# Patient Record
Sex: Female | Born: 1995 | Race: Black or African American | Hispanic: No | Marital: Single | Smoking: Former smoker
Health system: Southern US, Community
[De-identification: ages and names within clinical notes are randomized; demographics above are authoritative.]

## PROBLEM LIST (undated history)

## (undated) DIAGNOSIS — E079 Disorder of thyroid, unspecified: Secondary | ICD-10-CM

## (undated) DIAGNOSIS — L509 Urticaria, unspecified: Secondary | ICD-10-CM

## (undated) DIAGNOSIS — R7303 Prediabetes: Secondary | ICD-10-CM

## (undated) DIAGNOSIS — M109 Gout, unspecified: Secondary | ICD-10-CM

## (undated) HISTORY — DX: Prediabetes: R73.03

## (undated) HISTORY — DX: Urticaria, unspecified: L50.9

---

## 2017-03-14 ENCOUNTER — Ambulatory Visit (HOSPITAL_COMMUNITY)
Admission: EM | Admit: 2017-03-14 | Discharge: 2017-03-14 | Disposition: A | Discharge status: Home / Self Care | Payer: Self-pay | Attending: Family Medicine | Admitting: Family Medicine

## 2017-03-14 ENCOUNTER — Encounter (HOSPITAL_COMMUNITY): Payer: Self-pay | Admitting: Emergency Medicine

## 2017-03-14 DIAGNOSIS — R21 Rash and other nonspecific skin eruption: Secondary | ICD-10-CM

## 2017-03-14 DIAGNOSIS — N3000 Acute cystitis without hematuria: Secondary | ICD-10-CM

## 2017-03-14 LAB — POCT URINALYSIS DIP (DEVICE)
BILIRUBIN URINE: NEGATIVE
GLUCOSE, UA: NEGATIVE mg/dL
HGB URINE DIPSTICK: NEGATIVE
Ketones, ur: NEGATIVE mg/dL
NITRITE: NEGATIVE
Protein, ur: NEGATIVE mg/dL
SPECIFIC GRAVITY, URINE: 1.015 (ref 1.005–1.030)
Urobilinogen, UA: 0.2 mg/dL (ref 0.0–1.0)
pH: 7 (ref 5.0–8.0)

## 2017-03-14 LAB — POCT PREGNANCY, URINE: Preg Test, Ur: NEGATIVE

## 2017-03-14 MED ORDER — CEPHALEXIN 500 MG PO CAPS: 500.0000 mg | ORAL_CAPSULE | Freq: Four times a day (QID) | ORAL | 0 refills | Status: DC

## 2017-03-14 MED ORDER — PHENAZOPYRIDINE HCL 200 MG PO TABS: 200.0000 mg | ORAL_TABLET | Freq: Three times a day (TID) | ORAL | 0 refills | Status: DC | PRN

## 2017-03-14 MED ORDER — CLOTRIMAZOLE-BETAMETHASONE 1-0.05 % EX CREA: TOPICAL_CREAM | CUTANEOUS | 0 refills | Status: DC

## 2017-03-14 NOTE — Discharge Instructions (Signed)
I have attached the contact information for [facility], contact them to schedule an appointment to establish for primary care.   You are being treated today for a urinary tract infection. I have prescribed Keflex, take 1 tablet 4 times a day for 5 days. I have also prescribed Pyridium. Take 1 tablet a day 3 times a day for 2 days. Drink plenty of fluids and rest. Should your symptoms fail to resolve, follow up with your primary care provider or return to clinic.   For your rash, prescribed Lotrisone, apply to the affected area twice a day. If it persists, return to clinic in one week as needed.

## 2017-03-14 NOTE — ED Triage Notes (Signed)
Rash in groin area, inner thigh area has been present for 2 weeks, initially itched, sometimes stings/  This is an area that patient shaves.    uti symptoms for 2 days .  Burning with urination Feel the need to urinate frequently, but barely any urine is passed.    Patient feels weakness at night while sleeping.

## 2017-03-15 NOTE — ED Provider Notes (Signed)
CSN: [id number]     Arrival date & time 03/14/17  1915 History   None    Chief Complaint  Patient presents with  . Urinary Tract Infection   (Consider location/radiation/quality/duration/timing/severity/associated sxs/prior Treatment) Kelly LopesDorian Potter is a 21 y.o. Female who [facility] with a chief complaint of dysuria, frequency, and urgency for 3-5 days. She also has complaint of a rash at her "bikini line" that has been present for two weeks, initially itchy, however there is no pain. Pt is sexually active, however denies female partners and does not believe she has risk for STD/STIs.   The history is provided by the patient.  Urinary Tract Infection  Pain quality:  Aching and burning Pain severity:  Moderate Onset quality:  Gradual Duration:  3 days Timing:  Intermittent Chronicity:  New Recent urinary tract infections: no   Relieved by:  Nothing Worsened by:  Nothing Ineffective treatments:  None tried Urinary symptoms: discolored urine and frequent urination   Urinary symptoms: no foul-smelling urine, no hematuria, no hesitancy and no bladder incontinence   Associated symptoms: no abdominal pain, no fever, no flank pain, no genital lesions, no nausea, no vaginal discharge and no vomiting   Risk factors: sexually active   Risk factors: not pregnant and no sexually transmitted infections     History reviewed. No pertinent past medical history. History reviewed. No pertinent surgical history. No family history on file. Social History  Substance Use Topics  . Smoking status: Never Smoker  . Smokeless tobacco: Not on file  . Alcohol use Yes   OB History    No data available     Review of Systems  Constitutional: Negative for chills and fever.  HENT: Negative.   Respiratory: Negative.   Cardiovascular: Negative.   Gastrointestinal: Negative for abdominal pain, diarrhea, nausea and vomiting.  Genitourinary: Positive for dysuria, frequency and  urgency. Negative for flank pain and vaginal discharge.  Musculoskeletal: Negative.   Skin: Positive for rash.  Neurological: Negative.     Allergies  Claritin [loratadine]  Home Medications   Prior to Admission medications   Medication Sig Start Date End Date Taking? Authorizing Provider  diphenhydrAMINE (BENADRYL) 25 mg capsule Take 25 mg by mouth every 6 (six) hours as needed.   Yes [provider]  cephALEXin (KEFLEX) 500 MG capsule Take 1 capsule (500 mg total) by mouth 4 (four) times daily. 03/14/17   Dorena BodoKennard, Juma Oxley, NP  clotrimazole-betamethasone (LOTRISONE) cream Apply to affected area 2 times daily prn 03/14/17   Dorena BodoKennard, Tala Eber, NP  phenazopyridine (PYRIDIUM) 200 MG tablet Take 1 tablet (200 mg total) by mouth 3 (three) times daily as needed for pain. 03/14/17   Dorena BodoKennard, Delenn Ahn, NP   Meds Ordered and Administered this Visit  Medications - No data to display  BP 119/86 (BP Location: Right Arm)   Pulse (!) 104   Temp 99 F (37.2 C) (Oral)   Resp 18   LMP 03/01/2017   SpO2 98%  No data found.   Physical Exam  Constitutional: She is oriented to person, place, and time. She appears well-developed and well-nourished. No distress.  HENT:  Head: Normocephalic.  Right Ear: External ear normal.  Left Ear: External ear normal.  Mouth/Throat: Oropharynx is clear and moist.  Eyes: Conjunctivae are normal.  Neck: Normal range of motion.  Abdominal: Soft. There is no tenderness.  Genitourinary:     Genitourinary Comments: Erythemic patch, without scale, there are no pustules, or vesicles  within the lesions  Neurological: She is alert and oriented to person, place, and time.  Skin: Skin is warm and dry. Capillary refill takes less than 2 seconds. Rash noted. She is not diaphoretic.  Psychiatric: She has a normal mood and affect. Her behavior is normal.  Nursing note and vitals reviewed.   Urgent Care Course     Procedures (including critical care  time)  Labs Review Labs Reviewed  POCT URINALYSIS DIP (DEVICE) - Abnormal; Notable for the following:       Result Value   Leukocytes, UA TRACE (*)    All other components within normal limits  POCT PREGNANCY, URINE    Imaging Review No results found.   Visual Acuity Review  Right Eye Distance:   Left Eye Distance:   Bilateral Distance:    Right Eye Near:   Left Eye Near:    Bilateral Near:         MDM   1. Acute cystitis without hematuria   2. Rash and nonspecific skin eruption     Kelly Potter is a 21 y.o. female here with urinary urgency, frequency, and dysuria along with a rash.  For rash, it does not appear to be herpetic, folliculitis, or cellulitis, will treat with Clotrisome. Return to clinic as needed if symptoms persist for 1-2 weeks.  Differential diagnosis for urinary symptoms includes, but is not limited to, UTI, pyelonephritis, ureterallithiasis, STI, reaction to hygiene products, and others.  VSS. Exam without any abdominal or CVAT. Pt is afebrile & has not been vomiting. UA is significant for trace leukocytes, only mildly sugestive of UTI. Will treat with Keflex and pyridium.Follow up with PCP or return to clinic in 3-5 days for recheck as needed. If symptoms worsen, develop back pain/fever/vomiting return to the urgent care, or go to the ER for further treatment.      Dorena Bodo, NP 03/15/17 [phone]

## 2017-08-07 ENCOUNTER — Ambulatory Visit: Payer: Self-pay | Admitting: *Deleted

## 2017-08-07 ENCOUNTER — Ambulatory Visit (HOSPITAL_COMMUNITY)
Admission: EM | Admit: 2017-08-07 | Discharge: 2017-08-07 | Disposition: A | Discharge status: Home / Self Care | Payer: BLUE CROSS/BLUE SHIELD | Attending: Internal Medicine | Admitting: Internal Medicine

## 2017-08-07 ENCOUNTER — Encounter (HOSPITAL_COMMUNITY): Payer: Self-pay | Admitting: Emergency Medicine

## 2017-08-07 DIAGNOSIS — R220 Localized swelling, mass and lump, head: Secondary | ICD-10-CM

## 2017-08-07 HISTORY — DX: Disorder of thyroid, unspecified: E07.9

## 2017-08-07 HISTORY — DX: Gout, unspecified: M10.9

## 2017-08-07 MED ORDER — DEXAMETHASONE SODIUM PHOSPHATE 10 MG/ML IJ SOLN
10.0000 mg | Freq: Once | INTRAMUSCULAR | Status: AC
  Administered 2017-08-07: 10 mg via INTRAMUSCULAR

## 2017-08-07 MED ORDER — PREDNISONE 10 MG PO TABS: 20.0000 mg | ORAL_TABLET | Freq: Every day | ORAL | 0 refills | Status: AC

## 2017-08-07 MED ORDER — DEXAMETHASONE SODIUM PHOSPHATE 10 MG/ML IJ SOLN
INTRAMUSCULAR | Status: AC
  Filled 2017-08-07: qty 1

## 2017-08-07 NOTE — ED Provider Notes (Signed)
[facility]    CSN: [id number] Arrival date & time: 08/07/17  1506     History   Chief Complaint Chief Complaint  Patient presents with  . Facial Swelling    HPI Kelly Potter is a 21 y.o. female coming in for facial swelling- lips and cheeks. Her swelling began last night around 8 o'clock. She took some benadryl went to sleep woke up again at 3 am with continued swelling and took another dose. She reports minor tingling and numbness, with minor pain. She reports a history of seeing an allergist for frequent hive break outs. She has never experienced facial swelling. She denies any known new contact to food or medication yesterday. No fever, nausea, vomiting, abdominal pain.    HPI  Past Medical History:  Diagnosis Date  . Gout   . Thyroid disease     There are no active problems to display for this patient.   History reviewed. No pertinent surgical history.  OB History    No data available       Home Medications    Prior to Admission medications   Medication Sig Start Date End Date Taking? Authorizing Provider  cephALEXin (KEFLEX) 500 MG capsule Take 1 capsule (500 mg total) by mouth 4 (four) times daily. 03/14/17   Kelly BodoKennard, Lawrence, NP  clotrimazole-betamethasone (LOTRISONE) cream Apply to affected area 2 times daily prn 03/14/17   Kelly BodoKennard, Lawrence, NP  diphenhydrAMINE (BENADRYL) 25 mg capsule Take 25 mg by mouth every 6 (six) hours as needed.    [provider]  phenazopyridine (PYRIDIUM) 200 MG tablet Take 1 tablet (200 mg total) by mouth 3 (three) times daily as needed for pain. 03/14/17   Kelly BodoKennard, Lawrence, NP  predniSONE (DELTASONE) 10 MG tablet Take 2 tablets (20 mg total) by mouth daily with breakfast for 4 days. 08/07/17 08/11/17  Kelly Mareno, Junius CreamerHallie C, PA-Potter    Family History History reviewed. No pertinent family history.  Social History Social History   Tobacco Use  . Smoking status: Never Smoker  Substance Use Topics  . Alcohol use:  Yes  . Drug use: No     Allergies   Claritin [loratadine]   Review of Systems Review of Systems  Constitutional: Negative for chills and fever.  HENT: Positive for facial swelling. Negative for drooling, ear pain, sore throat and trouble swallowing.   Respiratory: Negative for shortness of breath and wheezing.   Cardiovascular: Negative for chest pain.  Gastrointestinal: Negative for abdominal pain, diarrhea, nausea and vomiting.     Physical Exam Triage Vital Signs ED Triage Vitals  Enc Vitals Group     BP 08/07/17 1550 (!) 141/106     Pulse Rate 08/07/17 1550 89     Resp 08/07/17 1550 18     Temp 08/07/17 1550 98.5 F (36.9 Potter)     Temp Source 08/07/17 1550 Oral     SpO2 08/07/17 1550 100 %     Weight --      Height --      Head Circumference --      Peak Flow --      Pain Score 08/07/17 1551 3     Pain Loc --      Pain Edu? --      Excl. in GC? --    No data found.  Updated Vital Signs BP (!) 141/106 (BP Location: Right Arm)   Pulse 89   Temp 98.5 F (36.9 Potter) (Oral)   Resp 18   SpO2  100%   Physical Exam  Constitutional: She appears well-developed and well-nourished.  Overweight female in NAD  HENT:  Head: Normocephalic and atraumatic. Head is without right periorbital erythema and without left periorbital erythema.  Right Ear: Tympanic membrane and ear canal normal.  Left Ear: Tympanic membrane and ear canal normal.  Mouth/Throat: Oropharynx is clear and moist. No uvula swelling. No oropharyngeal exudate.  Cardiovascular: Normal rate and regular rhythm.  Pulmonary/Chest: Effort normal. No accessory muscle usage. No respiratory distress.  Skin:  Mild swelling to perioral area and cheeks. No erythema or warmth. No rash or hives.     UC Treatments / Results  Labs (all labs ordered are listed, but only abnormal results are displayed) Labs Reviewed - No data to display  EKG  EKG Interpretation None       Radiology No results  found.  Procedures Procedures (including critical care time)  Medications Ordered in UC Medications  dexamethasone (DECADRON) injection 10 mg (10 mg Intramuscular Given 08/07/17 1720)     Initial Impression / Assessment and Plan / UC Course  I have reviewed the triage vital signs and the nursing notes.  Pertinent labs & imaging results that were available during my care of the patient were reviewed by me and considered in my medical decision making (see chart for details).    Patient is likely experiencing an allergic reaction from an unknown cause. She was given an injection of steroids today. Prednisone 20 mg x 4 days was sent and instructed to begin taking tomorrow. Continue to take benadryl every 4 hours. Advised I expect your swelling to resolve after 2-3 days.    Informed her BP was elevated and advised to be followed by PCP.  Please go to ED if swelling worsens and you are unable to breath, tongue swelling, throat swelling.   Final Clinical Impressions(s) / UC Diagnoses   Final diagnoses:  Facial swelling    ED Discharge Orders        Ordered    predniSONE (DELTASONE) 10 MG tablet  Daily with breakfast     08/07/17 1714       Controlled Substance Prescriptions [place] Controlled Substance Registry consulted? Not Applicable   Kelly Potter, [place] 08/07/17 1731

## 2017-08-07 NOTE — Discharge Instructions (Addendum)
You are likely experiencing an allergic reaction from an unknown cause. We gave you an injection of steroids today.  Prednisone was sent and you should begin taking tomorrow.  Continue to take benadryl every 4 hours.  I expect your swelling to resolve after 2-3 days.   Please go to ED if swelling worsens and you are unable to breath, tongue swelling, throat swelling.

## 2017-08-07 NOTE — Telephone Encounter (Addendum)
Pt called and stated that her face started swelling today. It started with her lips now has gone down to her neck. She has taken several doses of Benadryl "stated 8 doses since 3am) Stated now is having a little difficult swallowing.  She has an EPI pen but it has expired.  Pt advised to go the urgent care to be checked out.   Reason for Disposition . SEVERE swelling of the entire face  Answer Assessment - Initial Assessment Questions 1. ONSET: "When did the swelling start?" (e.g., minutes, hours, days)     This morning 2. LOCATION: "What part of the face is swollen?"     Whole face 3. SEVERITY: "How swollen is it?"     severe 4. ITCHING: "Is there any itching?" If so, ask: "How much?"   (Scale 1-10; mild, moderate or severe)     no 5. PAIN: "Is the swelling painful to touch?" If so, ask: "How painful is it?"   (Scale 1-10; mild, moderate or severe)     #5 6. FEVER: "Do you have a fever?" If so, ask: "What is it, how was it measured, and when did it start?"      no 7. CAUSE: "What do you think is causing the face swelling?"     dont know, have allergies 8. RECURRENT SYMPTOM: "Have you had face swelling before?" If so, ask: "When was the last time?" "What happened that time?"     A couple of months ago it happened 9. OTHER SYMPTOMS: "Do you have any other symptoms?" (e.g., toothache, leg swelling)     Headache last night 10. PREGNANCY: "Is there any chance you are pregnant?" "When was your last menstrual period?"       No LMP now  Protocols used: [facility]

## 2017-08-07 NOTE — ED Triage Notes (Signed)
Pt here for swelling to lips and cheeks with some pain; pt unsure what could be causing; started last night; no distress noted

## 2017-08-10 NOTE — Telephone Encounter (Signed)
Opened in error

## 2017-10-12 ENCOUNTER — Encounter: Payer: BLUE CROSS/BLUE SHIELD | Admitting: Obstetrics and Gynecology

## 2018-05-19 ENCOUNTER — Ambulatory Visit (HOSPITAL_COMMUNITY)
Admission: EM | Admit: 2018-05-19 | Discharge: 2018-05-19 | Disposition: A | Discharge status: Home / Self Care | Payer: BLUE CROSS/BLUE SHIELD | Attending: Family Medicine | Admitting: Family Medicine

## 2018-05-19 ENCOUNTER — Encounter (HOSPITAL_COMMUNITY): Payer: Self-pay | Admitting: Emergency Medicine

## 2018-05-19 DIAGNOSIS — L509 Urticaria, unspecified: Secondary | ICD-10-CM

## 2018-05-19 MED ORDER — METHYLPREDNISOLONE SODIUM SUCC 125 MG IJ SOLR
80.0000 mg | Freq: Once | INTRAMUSCULAR | Status: AC
  Administered 2018-05-19: 80 mg via INTRAMUSCULAR

## 2018-05-19 MED ORDER — METHYLPREDNISOLONE SODIUM SUCC 125 MG IJ SOLR
INTRAMUSCULAR | Status: AC
  Filled 2018-05-19: qty 2

## 2018-05-19 MED ORDER — PREDNISONE 10 MG (21) PO TBPK: ORAL_TABLET | ORAL | 0 refills | Status: DC

## 2018-05-19 NOTE — Discharge Instructions (Addendum)
It was nice meeting you!!  We will treat you for hives. Steroid injection here in the clinic and prednisone prescription sent to the pharmacy Benadryl for itching. You can also use zyrtec it is less drowsy.  Follow up as needed for continued or worsening symptoms

## 2018-05-19 NOTE — ED Provider Notes (Addendum)
[facility]    CSN: [id number] Arrival date & time: 05/19/18  1140     History   Chief Complaint Chief Complaint  Patient presents with  . Rash    HPI Kelly Potter is a 22 y.o. female.   Patient is a 22 year old female past medical history of gout and thyroid disease.  She presents with hives and pruritus.  This is chronic for her and flares up from time to time.  She has been having it intermittently over the last couple months.  She uses Benadryl with typical relief from that.  The current episode has been 2 days.  She has been treating with Benadryl but only getting worse.  She feels that her lips are slightly swollen.  She woke up this morning with some eye swelling.  She denies any throat swelling, chest pain, shortness of breath. She has been allergy  tested in the past and is allergic to many things. She reports they are never able to pin point what causes the flares.   She does not smoke  ROS per HPI      Past Medical History:  Diagnosis Date  . Gout   . Thyroid disease     There are no active problems to display for this patient.   History reviewed. No pertinent surgical history.  OB History   None      Home Medications    Prior to Admission medications   Medication Sig Start Date End Date Taking? Authorizing Provider  cephALEXin (KEFLEX) 500 MG capsule Take 1 capsule (500 mg total) by mouth 4 (four) times daily. Patient not taking: Reported on 05/19/2018 03/14/17   Dorena Bodo, NP  clotrimazole-betamethasone (LOTRISONE) cream Apply to affected area 2 times daily prn 03/14/17   Dorena Bodo, NP  diphenhydrAMINE (BENADRYL) 25 mg capsule Take 25 mg by mouth every 6 (six) hours as needed.    [provider]  phenazopyridine (PYRIDIUM) 200 MG tablet Take 1 tablet (200 mg total) by mouth 3 (three) times daily as needed for pain. Patient not taking: Reported on 05/19/2018 03/14/17   Dorena Bodo, NP  predniSONE (STERAPRED UNI-PAK  21 TAB) 10 MG (21) TBPK tablet 6 tabs for 1 day, then 5 tabs for 1 das, then 4 tabs for 1 day, then 3 tabs for 1 day, 2 tabs for 1 day, then 1 tab for 1 day 05/19/18   Janace Aris, NP    Family History History reviewed. No pertinent family history.  Social History Social History   Tobacco Use  . Smoking status: Never Smoker  Substance Use Topics  . Alcohol use: Yes  . Drug use: No     Allergies   Claritin [loratadine]   Review of Systems Review of Systems   Physical Exam Triage Vital Signs ED Triage Vitals [05/19/18 1239]  Enc Vitals Group     BP 124/77     Pulse Rate 99     Resp 18     Temp 98.1 F (36.7 C)     Temp Source Oral     SpO2 100 %     Weight      Height      Head Circumference      Peak Flow      Pain Score      Pain Loc      Pain Edu?      Excl. in GC?    No data found.  Updated Vital Signs BP 124/77 (BP Location:  Left Arm)   Pulse 99   Temp 98.1 F (36.7 C) (Oral)   Resp 18   SpO2 100%   Visual Acuity Right Eye Distance:   Left Eye Distance:   Bilateral Distance:    Right Eye Near:   Left Eye Near:    Bilateral Near:     Physical Exam  Constitutional: She is oriented to person, place, and time. No distress.  Very pleasant. Non toxic or ill appearing.     HENT:  Head: Normocephalic and atraumatic.  Eyes: Conjunctivae are normal.  Neck: Normal range of motion.  Cardiovascular: Normal rate and regular rhythm.  Pulmonary/Chest: Effort normal.  Musculoskeletal: Normal range of motion.  Neurological: She is alert and oriented to person, place, and time.  Skin: Skin is warm and dry.  Widespread hives.   Psychiatric: She has a normal mood and affect.  Nursing note and vitals reviewed.    UC Treatments / Results  Labs (all labs ordered are listed, but only abnormal results are displayed) Labs Reviewed - No data to display  EKG None  Radiology No results found.  Procedures Procedures (including critical care  time)  Medications Ordered in UC Medications  methylPREDNISolone sodium succinate (SOLU-MEDROL) 125 mg/2 mL injection 80 mg (has no administration in time range)    Initial Impression / Assessment and Plan / UC Course  I have reviewed the triage vital signs and the nursing notes.  Pertinent labs & imaging results that were available during my care of the patient were reviewed by me and considered in my medical decision making (see chart for details).     Hives- steroid injection in clinic Prescription for prednisone.  benadryl or zyrtec as needed for itching.  Follow up as needed for continued or worsening symptoms  Final Clinical Impressions(s) / UC Diagnoses   Final diagnoses:  Hives     Discharge Instructions     It was nice meeting you!!  We will treat you for hives. Steroid injection here in the clinic and prednisone prescription sent to the pharmacy Benadryl for itching. You can also use zyrtec it is less drowsy.  Follow up as needed for continued or worsening symptoms       ED Prescriptions    Medication Sig Dispense Auth. Provider   predniSONE (STERAPRED UNI-PAK 21 TAB) 10 MG (21) TBPK tablet 6 tabs for 1 day, then 5 tabs for 1 das, then 4 tabs for 1 day, then 3 tabs for 1 day, 2 tabs for 1 day, then 1 tab for 1 day 21 tablet Jaci Lazier, Fidencio Duddy A, NP     Controlled Substance Prescriptions [place] Controlled Substance Registry consulted? Not Applicable   Janace Aris, NP 05/19/18 1308    Dahlia Byes A, NP 05/19/18 1310

## 2018-05-19 NOTE — ED Triage Notes (Signed)
Pt here for rash that is chronic in nature; pt sts started this am

## 2021-01-07 ENCOUNTER — Ambulatory Visit (INDEPENDENT_AMBULATORY_CARE_PROVIDER_SITE_OTHER): Payer: 59 | Admitting: Primary Care

## 2021-01-07 ENCOUNTER — Other Ambulatory Visit: Payer: Self-pay

## 2021-01-07 ENCOUNTER — Encounter (INDEPENDENT_AMBULATORY_CARE_PROVIDER_SITE_OTHER): Payer: Self-pay | Admitting: Primary Care

## 2021-01-07 VITALS — BP 100/72 | HR 69 | Temp 97.3°F | Ht 65.0 in | Wt 297.6 lb

## 2021-01-07 DIAGNOSIS — E079 Disorder of thyroid, unspecified: Secondary | ICD-10-CM | POA: Diagnosis not present

## 2021-01-07 DIAGNOSIS — N912 Amenorrhea, unspecified: Secondary | ICD-10-CM

## 2021-01-07 DIAGNOSIS — Z833 Family history of diabetes mellitus: Secondary | ICD-10-CM | POA: Diagnosis not present

## 2021-01-07 DIAGNOSIS — Z7689 Persons encountering health services in other specified circumstances: Secondary | ICD-10-CM

## 2021-01-07 DIAGNOSIS — R7981 Abnormal blood-gas level: Secondary | ICD-10-CM

## 2021-01-07 LAB — POCT GLYCOSYLATED HEMOGLOBIN (HGB A1C): Hemoglobin A1C: 5.2 % (ref 4.0–5.6)

## 2021-01-07 NOTE — Progress Notes (Signed)
New Patient Office Visit  Subjective:  Patient ID: Kelly Potter, female    DOB: 1995-10-06  Age: 25 y.o. MRN: [id number]  CC:  Chief Complaint  Patient presents with  . New Patient (Initial Visit)    HPI Ms. Kelly Potter 25 year old morbid obese female she  presents for establishment of care.  She admits to not having regular checkups but has decided it is time to start taking care of herself and going to doctor's.  Her main concern is she had amenorrhea for 6 months and then a cycle for 5 days.  The only lifestyle changes she thinks may was monitoring her diet trying to lose weight and exercising.  She denies being pregnant and not on any birth control. Past Medical History:  Diagnosis Date  . Gout   . Thyroid disease     History reviewed. No pertinent surgical history.  Family history of grandmother on insulin deceased and on on mother side is a diabetic on her mom  Social History   Socioeconomic History  . Marital status: Single    Spouse name: Not on file  . Number of children: Not on file  . Years of education: Not on file  . Highest education level: Not on file  Occupational History  . Not on file  Tobacco Use  . Smoking status: Never Smoker  . Smokeless tobacco: Never Used  Substance and Sexual Activity  . Alcohol use: Yes  . Drug use: No  . Sexual activity: Not on file  Other Topics Concern  . Not on file  Social History Narrative  . Not on file   Social Determinants of Health   Financial Resource Strain: Not on file  Food Insecurity: Not on file  Transportation Needs: Not on file  Physical Activity: Not on file  Stress: Not on file  Social Connections: Not on file  Intimate Partner Violence: Not on file    ROS Review of Systems  Constitutional: Positive for fatigue.  Genitourinary: Positive for menstrual problem.  All other systems reviewed and are negative.   Objective:   Today's Vitals: BP 100/72 (BP Location: Right Arm, Patient Position:  Sitting, Cuff Size: Large)   Pulse 69   Temp (!) 97.3 F (36.3 C) (Temporal)   Ht 5' 5"  (1.651 m)   Wt 297 lb 9.6 oz (135 kg)   LMP 12/09/2020 (Exact Date)   SpO2 93%   BMI 49.52 kg/m   Physical Exam Vitals reviewed.  Constitutional:      Appearance: She is obese.     Comments: morbid  HENT:     Head: Normocephalic.     Right Ear: Tympanic membrane and external ear normal.     Left Ear: Tympanic membrane and external ear normal.     Nose: Nose normal.  Eyes:     Extraocular Movements: Extraocular movements intact.     Pupils: Pupils are equal, round, and reactive to light.  Cardiovascular:     Rate and Rhythm: Normal rate and regular rhythm.  Pulmonary:     Effort: Pulmonary effort is normal.     Breath sounds: Normal breath sounds.  Abdominal:     General: Abdomen is flat. Bowel sounds are normal. There is distension.     Palpations: Abdomen is soft.  Musculoskeletal:        General: Normal range of motion.  Skin:    General: Skin is warm and dry.  Neurological:     Mental Status: She is  alert and oriented to person, place, and time.  Psychiatric:        Mood and Affect: Mood normal.        Behavior: Behavior normal.        Thought Content: Thought content normal.        Judgment: Judgment normal.     Assessment & Plan:    Outpatient Encounter Medications as of 01/07/2021  Medication Sig  . [DISCONTINUED] cephALEXin (KEFLEX) 500 MG capsule Take 1 capsule (500 mg total) by mouth 4 (four) times daily. (Patient not taking: Reported on 05/19/2018)  . [DISCONTINUED] clotrimazole-betamethasone (LOTRISONE) cream Apply to affected area 2 times daily prn  . [DISCONTINUED] diphenhydrAMINE (BENADRYL) 25 mg capsule Take 25 mg by mouth every 6 (six) hours as needed.  . [DISCONTINUED] phenazopyridine (PYRIDIUM) 200 MG tablet Take 1 tablet (200 mg total) by mouth 3 (three) times daily as needed for pain. (Patient not taking: Reported on 05/19/2018)  . [DISCONTINUED] predniSONE  (STERAPRED UNI-PAK 21 TAB) 10 MG (21) TBPK tablet 6 tabs for 1 day, then 5 tabs for 1 das, then 4 tabs for 1 day, then 3 tabs for 1 day, 2 tabs for 1 day, then 1 tab for 1 day   No facility-administered encounter medications on file as of 01/07/2021.  Luba was seen today for new patient (initial visit).  Diagnoses and all orders for this visit:  Family history of diabetes mellitus type II -     HgB A1c 5.2 per ADA guidelines she is not cream or diabetic  Amenorrhea 6 months without a menstrual cycle prior to that she was having monthly, menstrual cycle returned  was 12/09/2020 (exact date).  Lasted exactly 5 days . She is not sexually active -     CBC with Differential  Encounter to establish care Establishing care with new PCP -     CMP14+EGFR  Morbid obesity ([place]) Morbid obesity is greater than 40 indicating an excess in caloric intake or underlining conditions. This may lead to other co-morbidities.  Examples given diabetes, hypertension and respiratory complications. Lifestyle modifications of diet and exercise may reduce obesity.  She has recently started exercising daily and monitoring her caloric intake and carbohydrates -     Lipid Panel  Thyroid disease She has a history of thyroid disease never on medication.  She also mention being fatigued and wanting to sleep all the time with some hair loss.   Will check labs -     TSH + free T4 -     CMP14+EGFR  Borderline low oxygen saturation level Oxygen saturation on room air initially was 89 rechecked and was 93.  Patient and I ran 500 feet she desatted to 80% with exertion after several deep breaths her O2 sats improved to 96% on room air.  Lungs CTA. No shortness of breath after running 500 feet   Follow-up: Return in about 6 months (around 07/09/2021) for weight check up / sats.   Kerin Perna, NP

## 2021-01-08 LAB — CBC WITH DIFFERENTIAL/PLATELET
Basophils Absolute: 0 10*3/uL (ref 0.0–0.2)
Basos: 1 %
EOS (ABSOLUTE): 0.2 10*3/uL (ref 0.0–0.4)
Eos: 3 %
Hematocrit: 41.8 % (ref 34.0–46.6)
Hemoglobin: 13 g/dL (ref 11.1–15.9)
Immature Grans (Abs): 0 10*3/uL (ref 0.0–0.1)
Immature Granulocytes: 0 %
Lymphocytes Absolute: 1.8 10*3/uL (ref 0.7–3.1)
Lymphs: 26 %
MCH: 27 pg (ref 26.6–33.0)
MCHC: 31.1 g/dL — ABNORMAL LOW (ref 31.5–35.7)
MCV: 87 fL (ref 79–97)
Monocytes Absolute: 0.8 10*3/uL (ref 0.1–0.9)
Monocytes: 11 %
Neutrophils Absolute: 4.1 10*3/uL (ref 1.4–7.0)
Neutrophils: 59 %
Platelets: 281 10*3/uL (ref 150–450)
RBC: 4.82 x10E6/uL (ref 3.77–5.28)
RDW: 13 % (ref 11.7–15.4)
WBC: 6.9 10*3/uL (ref 3.4–10.8)

## 2021-01-08 LAB — CMP14+EGFR
ALT: 12 IU/L (ref 0–32)
AST: 14 IU/L (ref 0–40)
Albumin/Globulin Ratio: 1.6 (ref 1.2–2.2)
Albumin: 4.1 g/dL (ref 3.9–5.0)
Alkaline Phosphatase: 72 IU/L (ref 44–121)
BUN/Creatinine Ratio: 7 — ABNORMAL LOW (ref 9–23)
BUN: 6 mg/dL (ref 6–20)
Bilirubin Total: 0.5 mg/dL (ref 0.0–1.2)
CO2: 19 mmol/L — ABNORMAL LOW (ref 20–29)
Calcium: 9.3 mg/dL (ref 8.7–10.2)
Chloride: 103 mmol/L (ref 96–106)
Creatinine, Ser: 0.88 mg/dL (ref 0.57–1.00)
Globulin, Total: 2.6 g/dL (ref 1.5–4.5)
Glucose: 79 mg/dL (ref 65–99)
Potassium: 4.5 mmol/L (ref 3.5–5.2)
Sodium: 137 mmol/L (ref 134–144)
Total Protein: 6.7 g/dL (ref 6.0–8.5)
eGFR: 93 mL/min/{1.73_m2} (ref >59)

## 2021-01-08 LAB — LIPID PANEL
Chol/HDL Ratio: 2.6 ratio (ref 0.0–4.4)
Cholesterol, Total: 147 mg/dL (ref 100–199)
HDL: 56 mg/dL (ref >39)
LDL Chol Calc (NIH): 77 mg/dL (ref 0–99)
Triglycerides: 72 mg/dL (ref 0–149)
VLDL Cholesterol Cal: 14 mg/dL (ref 5–40)

## 2021-01-08 LAB — TSH+FREE T4
Free T4: 0.66 ng/dL — ABNORMAL LOW (ref 0.82–1.77)
TSH: 3.35 u[IU]/mL (ref 0.450–4.500)

## 2021-01-11 ENCOUNTER — Other Ambulatory Visit (INDEPENDENT_AMBULATORY_CARE_PROVIDER_SITE_OTHER): Payer: Self-pay | Admitting: Primary Care

## 2021-01-11 MED ORDER — LEVOTHYROXINE SODIUM 25 MCG PO TABS: 25.0000 ug | ORAL_TABLET | Freq: Every day | ORAL | 1 refills | Status: DC

## 2021-02-08 ENCOUNTER — Emergency Department (HOSPITAL_COMMUNITY)
Admission: EM | Admit: 2021-02-08 | Discharge: 2021-02-08 | Disposition: A | Discharge status: Home / Self Care | Payer: Self-pay | Attending: Emergency Medicine | Admitting: Emergency Medicine

## 2021-02-08 ENCOUNTER — Other Ambulatory Visit: Payer: Self-pay

## 2021-02-08 ENCOUNTER — Emergency Department (HOSPITAL_COMMUNITY): Payer: Self-pay

## 2021-02-08 ENCOUNTER — Encounter (HOSPITAL_COMMUNITY): Payer: Self-pay

## 2021-02-08 DIAGNOSIS — R519 Headache, unspecified: Secondary | ICD-10-CM | POA: Insufficient documentation

## 2021-02-08 DIAGNOSIS — H538 Other visual disturbances: Secondary | ICD-10-CM | POA: Insufficient documentation

## 2021-02-08 DIAGNOSIS — R202 Paresthesia of skin: Secondary | ICD-10-CM | POA: Insufficient documentation

## 2021-02-08 LAB — CBC WITH DIFFERENTIAL/PLATELET
Abs Immature Granulocytes: 0.02 10*3/uL (ref 0.00–0.07)
Basophils Absolute: 0 10*3/uL (ref 0.0–0.1)
Basophils Relative: 1 %
Eosinophils Absolute: 0.1 10*3/uL (ref 0.0–0.5)
Eosinophils Relative: 2 %
HCT: 41.7 % (ref 36.0–46.0)
Hemoglobin: 13.7 g/dL (ref 12.0–15.0)
Immature Granulocytes: 0 %
Lymphocytes Relative: 30 %
Lymphs Abs: 1.9 10*3/uL (ref 0.7–4.0)
MCH: 28.2 pg (ref 26.0–34.0)
MCHC: 32.9 g/dL (ref 30.0–36.0)
MCV: 85.8 fL (ref 80.0–100.0)
Monocytes Absolute: 0.6 10*3/uL (ref 0.1–1.0)
Monocytes Relative: 9 %
Neutro Abs: 3.6 10*3/uL (ref 1.7–7.7)
Neutrophils Relative %: 58 %
Platelets: 261 10*3/uL (ref 150–400)
RBC: 4.86 MIL/uL (ref 3.87–5.11)
RDW: 13.4 % (ref 11.5–15.5)
WBC: 6.3 10*3/uL (ref 4.0–10.5)
nRBC: 0 % (ref 0.0–0.2)

## 2021-02-08 LAB — BASIC METABOLIC PANEL
Anion gap: 9 (ref 5–15)
BUN: 9 mg/dL (ref 6–20)
CO2: 20 mmol/L — ABNORMAL LOW (ref 22–32)
Calcium: 9.1 mg/dL (ref 8.9–10.3)
Chloride: 108 mmol/L (ref 98–111)
Creatinine, Ser: 0.77 mg/dL (ref 0.44–1.00)
GFR, Estimated: >60 mL/min (ref >60)
Glucose, Bld: 86 mg/dL (ref 70–99)
Potassium: 4.1 mmol/L (ref 3.5–5.1)
Sodium: 137 mmol/L (ref 135–145)

## 2021-02-08 LAB — I-STAT BETA HCG BLOOD, ED (MC, WL, AP ONLY): I-stat hCG, quantitative: <5 m[IU]/mL (ref <5)

## 2021-02-08 MED ORDER — METOCLOPRAMIDE HCL 5 MG/ML IJ SOLN
5.0000 mg | Freq: Once | INTRAMUSCULAR | Status: AC
  Administered 2021-02-08: 5 mg via INTRAVENOUS
  Filled 2021-02-08: qty 2

## 2021-02-08 MED ORDER — DIPHENHYDRAMINE HCL 50 MG/ML IJ SOLN
12.5000 mg | Freq: Once | INTRAMUSCULAR | Status: AC
  Administered 2021-02-08: 12.5 mg via INTRAVENOUS
  Filled 2021-02-08: qty 1

## 2021-02-08 MED ORDER — KETOROLAC TROMETHAMINE 30 MG/ML IJ SOLN
30.0000 mg | Freq: Once | INTRAMUSCULAR | Status: AC
  Administered 2021-02-08: 30 mg via INTRAVENOUS
  Filled 2021-02-08: qty 1

## 2021-02-08 MED ORDER — SODIUM CHLORIDE 0.9 % IV BOLUS
1000.0000 mL | Freq: Once | INTRAVENOUS | Status: AC
  Administered 2021-02-08: 1000 mL via INTRAVENOUS

## 2021-02-08 NOTE — Discharge Instructions (Signed)
You may alternate taking Tylenol and Ibuprofen as needed for pain control. You may take 400-600 mg of ibuprofen every 6 hours and [phone] mg of Tylenol every 6 hours. Do not exceed 4000 mg of Tylenol daily as this can lead to liver damage. Also, make sure to take Ibuprofen with meals as it can cause an upset stomach. Do not take other NSAIDs while taking Ibuprofen such as (Aleve, Naprosyn, Aspirin, Celebrex, etc) and do not take more than the prescribed dose as this can lead to ulcers and bleeding in your GI tract. You may use warm and cold compresses to help with your symptoms.   Please follow up with your primary doctor within the next 7-10 days for re-evaluation and further treatment of your symptoms. The neurology team will reach out to you in regards to setting up an appointment for follow up  Please return to the ER sooner if you have any new or worsening symptoms.

## 2021-02-08 NOTE — ED Notes (Signed)
Pt taken to MRI  

## 2021-02-08 NOTE — ED Triage Notes (Addendum)
Patient c/o headache, blurred vision, and tingling of her left hand left arm. Patient states she has swelling to the right face. Patient states symptoms started at 1000 this AM. Patient speaking complete sentences. No difficulty with breathing or swallowing.

## 2021-02-08 NOTE — ED Provider Notes (Signed)
[facility]-EMERGENCY DEPT Provider Note   CSN: [id number] Arrival date & time: 02/08/21  1104     History Chief Complaint  Patient presents with  . Headache  . Blurred Vision  . Tingling    Kelly Potter is a 25 y.o. female.  HPI   Pt is a 25 y/o f with a h/o gout, thyroid disease, who presents to the ED today for eval of a headache that started upon waking this morning. Pt rates headache at 7/10. States pain feels sharp and stabbing. Pain is constant.   She further reports multiple neurologic complaints that have been ongoing intermittently for the last month including tingling in various places of her body bilaterally. However, she became concerned today because she noted blurred vision in her right eye, tingling/swelling to the bilat hands (L>R), tingling to the bilat feet and swelling to the face.  She denies any associated weakness. She reports frequent headaches but this headache feels worse. She has never seen a neurologist. Denies neck pain, photosensitivity, fevers, chills, dizziness, lightheadedness.   Past Medical History:  Diagnosis Date  . Gout   . Thyroid disease     There are no problems to display for this patient.   History reviewed. No pertinent surgical history.   OB History   No obstetric history on file.     Family History  Problem Relation Age of Onset  . Hypertension Mother   . Hypertension Father   . Gout Father   . Thyroid disease Father     Social History   Tobacco Use  . Smoking status: Never Smoker  . Smokeless tobacco: Never Used  Vaping Use  . Vaping Use: Never used  Substance Use Topics  . Alcohol use: Yes  . Drug use: No    Home Medications Prior to Admission medications   Medication Sig Start Date End Date Taking? Authorizing Provider  levothyroxine (SYNTHROID) 25 MCG tablet Take 1 tablet (25 mcg total) by mouth daily before breakfast. 01/11/21   Grayce Sessions, NP    Allergies    Claritin  [loratadine]  Review of Systems   Review of Systems  Constitutional: Negative for fever.  HENT: Negative for ear pain and sore throat.   Eyes: Negative for visual disturbance.  Respiratory: Negative for cough and shortness of breath.   Cardiovascular: Negative for chest pain.  Gastrointestinal: Negative for abdominal pain, constipation, diarrhea, nausea and vomiting.  Genitourinary: Negative for dysuria and hematuria.  Musculoskeletal: Negative for back pain.  Skin: Negative for rash.  Neurological: Positive for numbness and headaches. Negative for weakness.  All other systems reviewed and are negative.   Physical Exam Updated Vital Signs BP 116/90   Pulse 78   Temp 98 F (36.7 C) (Oral)   Resp 18   Ht 5\' 5"  (1.651 m)   Wt 132.5 kg   LMP 07/11/2020   SpO2 100%   BMI 48.59 kg/m   Physical Exam Vitals and nursing note reviewed.  Constitutional:      General: She is not in acute distress.    Appearance: She is well-developed.  HENT:     Head: Normocephalic and atraumatic.  Eyes:     Conjunctiva/sclera: Conjunctivae normal.  Cardiovascular:     Rate and Rhythm: Normal rate and regular rhythm.     Heart sounds: No murmur heard.   Pulmonary:     Effort: Pulmonary effort is normal. No respiratory distress.     Breath sounds: Normal breath sounds. No  wheezing, rhonchi or rales.  Abdominal:     General: Bowel sounds are normal.     Palpations: Abdomen is soft.     Tenderness: There is no abdominal tenderness.  Musculoskeletal:     Cervical back: Neck supple.  Skin:    General: Skin is warm and dry.  Neurological:     Mental Status: She is alert.     Comments: Mental Status:  Alert, thought content appropriate, able to give a coherent history. Speech fluent without evidence of aphasia. Able to follow 2 step commands without difficulty.  Cranial Nerves:  II:  Peripheral visual fields grossly normal, pupils equal, round, reactive to light III,IV, VI: ptosis not  present, extra-ocular motions intact bilaterally  V,VII: smile symmetric, facial light touch sensation decreased on the right VIII: hearing grossly normal to voice  X: uvula elevates symmetrically  XI: bilateral shoulder shrug symmetric and strong XII: midline tongue extension without fassiculations Motor:  Normal tone. 5/5 strength of BUE and BLE major muscle groups including strong and equal grip strength and dorsiflexion/plantar flexion Sensory: decreased sensation to the LUE      ED Results / Procedures / Treatments   Labs (all labs ordered are listed, but only abnormal results are displayed) Labs Reviewed  BASIC METABOLIC PANEL - Abnormal; Notable for the following components:      Result Value   CO2 20 (*)    All other components within normal limits  CBC WITH DIFFERENTIAL/PLATELET  I-STAT BETA HCG BLOOD, ED ([facility], [facility], [facility] ONLY)    EKG None  Radiology MR BRAIN WO CONTRAST  Result Date: 02/08/2021 CLINICAL DATA:  Concern multiple sclerosis. EXAM: MRI HEAD WITHOUT CONTRAST TECHNIQUE: Multiplanar, multiecho pulse sequences of the brain and surrounding structures were obtained without intravenous contrast. COMPARISON:  None. FINDINGS: Brain: No acute infarction, hemorrhage, hydrocephalus, extra-axial collection or mass lesion. No white matter lesions. The absence of contrast precludes evaluation for enhancement. Vascular: Major arterial flow voids are maintained at the skull base. Small right vertebral artery, possibly non dominant. Skull and upper cervical spine: Normal marrow signal. Sinuses/Orbits: Clear sinuses. Other: No mastoid effusions. IMPRESSION: No evidence of acute intracranial abnormality. No white matter lesions. Electronically Signed   By: Feliberto Harts MD   On: 02/08/2021 14:04    Procedures Procedures   Medications Ordered in ED Medications  sodium chloride 0.9 % bolus 1,000 mL (1,000 mLs Intravenous New Bag/Given 02/08/21 1235)  ketorolac (TORADOL) 30 MG/ML  injection 30 mg (30 mg Intravenous Given 02/08/21 1234)  metoCLOPramide (REGLAN) injection 5 mg (5 mg Intravenous Given 02/08/21 1457)  diphenhydrAMINE (BENADRYL) injection 12.5 mg (12.5 mg Intravenous Given 02/08/21 1457)    ED Course  I have reviewed the triage vital signs and the nursing notes.  Pertinent labs & imaging results that were available during my care of the patient were reviewed by me and considered in my medical decision making (see chart for details).    MDM Rules/Calculators/A&P                          25 year old female presenting the emergency department today with headache and multiple neurologic complaints that have been ongoing intermittently for a month.  Neuro exam today is reassuring other than some sensory changes to the left hand/forearm and right side of the face. will order MRI to rule out MS or other emergent cause of symptoms.  Reviewed/interpreted labs which are reassuring.  No anemia or significant electrolyte derangement.  Kidney and liver function are reassuring.  MRI brain shows no acute abnormalities.  Patient was given migraine cocktail on reassessment she states she feels improved.  She will be referred to neurology as an outpatient we will have her return the emergency department for new or worsening symptoms in the meantime.  She voices understanding of the plan and reasons to return.  All questions answered.  Patient stable for discharge.  Final Clinical Impression(s) / ED Diagnoses Final diagnoses:  Acute nonintractable headache, unspecified headache type  Paresthesia    Rx / DC Orders ED Discharge Orders         Ordered    Ambulatory referral to Neurology       Comments: An appointment is requested in approximately: 2 weeks   02/08/21 1521           Rayne Du 02/08/21 1524    Cheryll Cockayne, MD 02/09/21 2014

## 2021-05-09 ENCOUNTER — Ambulatory Visit: Payer: BLUE CROSS/BLUE SHIELD | Admitting: Neurology

## 2021-05-09 ENCOUNTER — Encounter: Payer: Self-pay | Admitting: Neurology

## 2021-07-12 ENCOUNTER — Ambulatory Visit (INDEPENDENT_AMBULATORY_CARE_PROVIDER_SITE_OTHER): Payer: 59 | Admitting: Primary Care

## 2022-11-03 IMAGING — MR MR HEAD W/O CM
11 series · 48 of 48 positions shown · non-contrast
Comparison: None.

CLINICAL DATA: Concern multiple sclerosis.

EXAM:
MRI HEAD WITHOUT CONTRAST
TECHNIQUE: Multiplanar, multiecho pulse sequences of the brain and surrounding
structures were obtained without intravenous contrast.

[Series 5: DWI · axial · 3.0mm · 1.36mm/px · z∈[-78,+63]mm · 6 of 96 slices shown (1 of 2)]
[im 1/96]
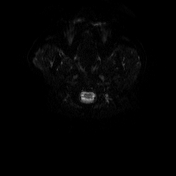
[im 20/96]
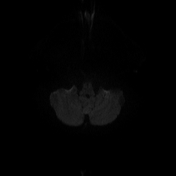
[im 39/96]
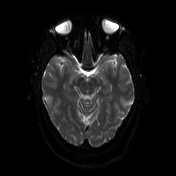
[im 58/96]
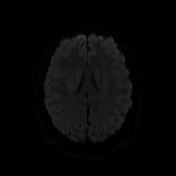
[im 77/96]
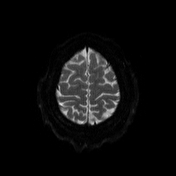
[im 96/96]
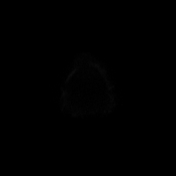

[Series 6: DWI · axial · 3.0mm · 1.36mm/px · z∈[-78,+63]mm · 3 of 48 slices shown (2 of 2)]
[im 1/48]
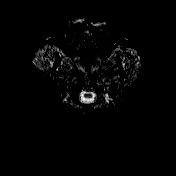
[im 24/48]
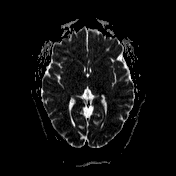
[im 48/48]
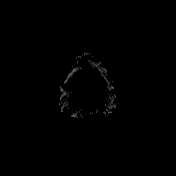

[Series 7: T1 · sagittal · 5.0mm · 0.75mm/px · 2 of 24 slices shown (1 of 2)]
[im 1/24]
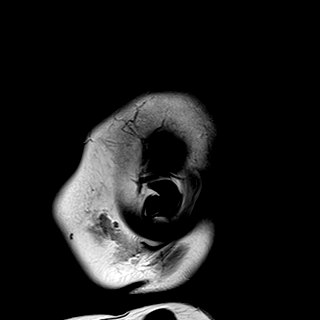
[im 24/24]
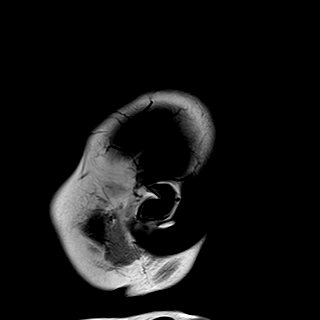

[Series 8: FLAIR · sagittal · 1.1mm · 0.50mm/px · 9 of 128 slices shown (1 of 2)]
[im 1/128]
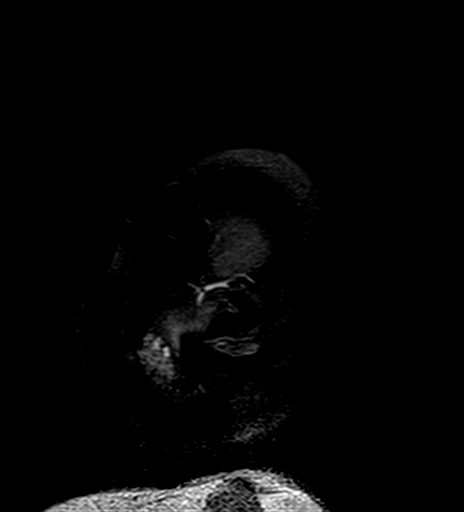
[im 16/128]
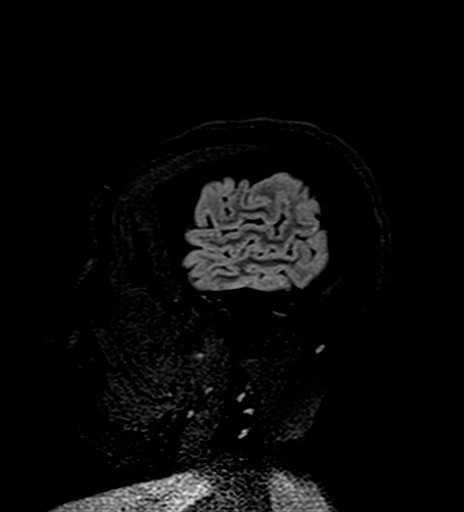
[im 32/128]
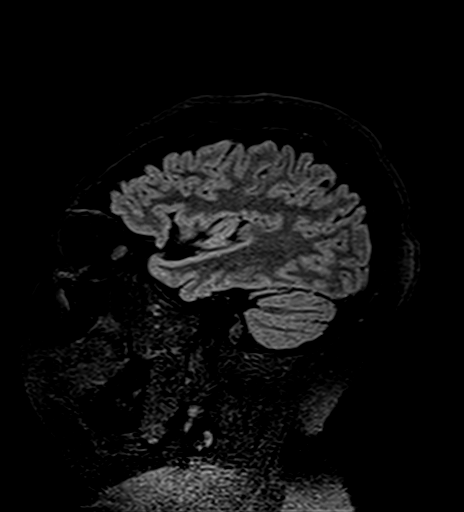
[im 48/128]
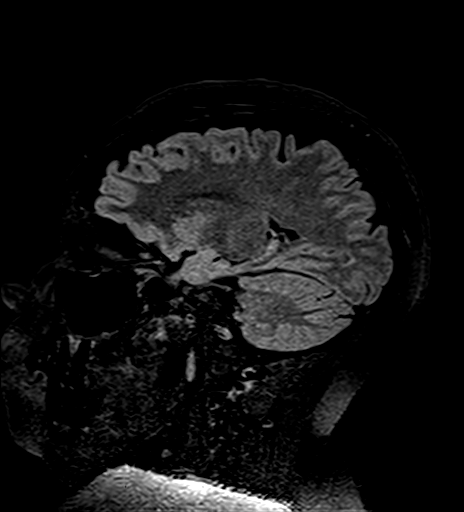
[im 64/128]
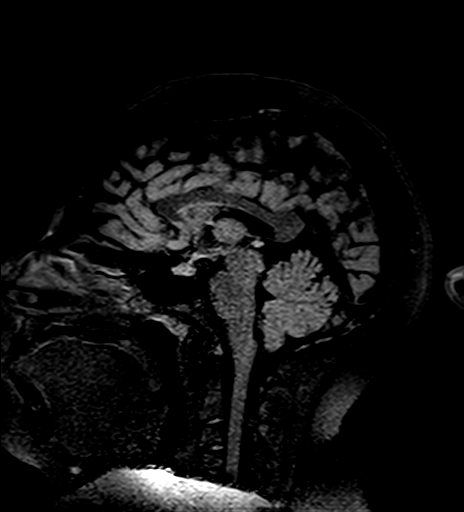
[im 80/128]
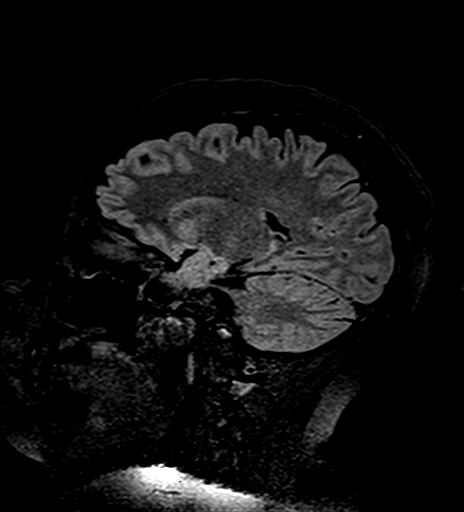
[im 96/128]
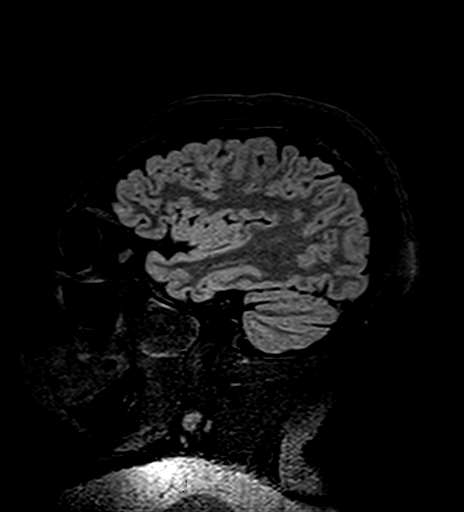
[im 112/128]
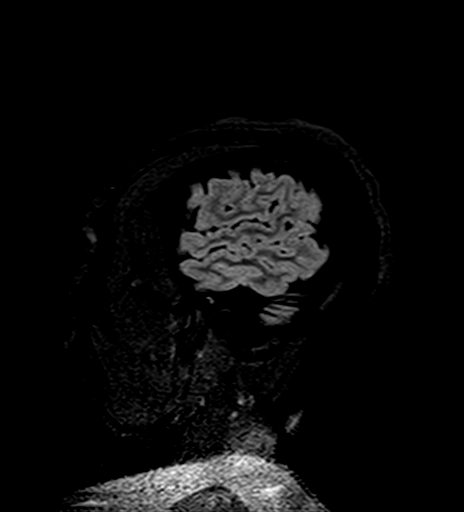
[im 128/128]
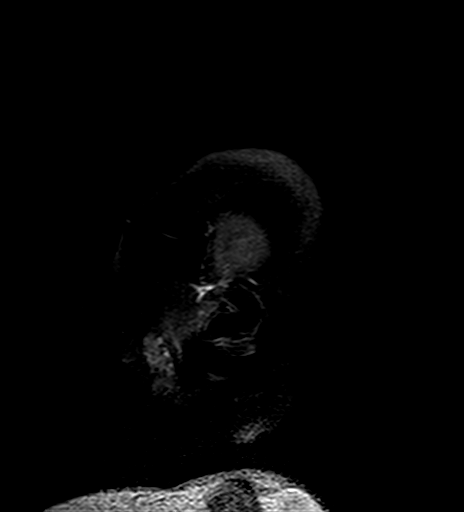

[Series 9: T2 · axial · 5.0mm · 0.62mm/px · z∈[-88,+74]mm · 2 of 26 slices shown (1 of 2)]
[im 1/26]
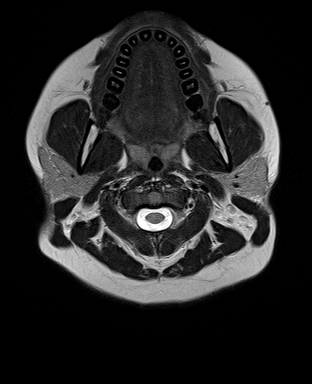
[im 26/26]
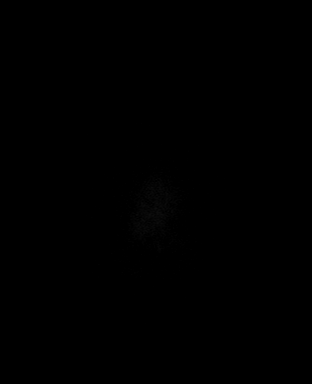

[Series 10: swi_images · axial · 3.0mm · 0.75mm/px · z∈[-90,+75]mm · 4 of 56 slices shown]
[im 1/56]
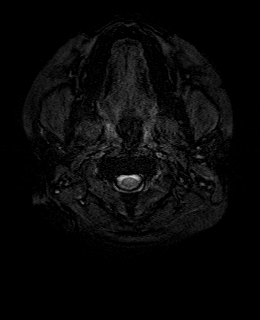
[im 19/56]
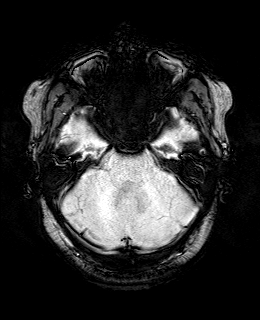
[im 37/56]
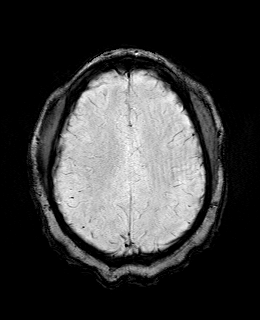
[im 56/56]
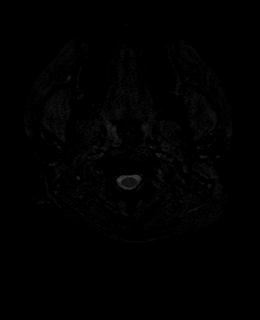

[Series 12: FLAIR · axial · 3.0mm · 0.75mm/px · z∈[-84,+69]mm · 4 of 52 slices shown (2 of 2)]
[im 1/52]
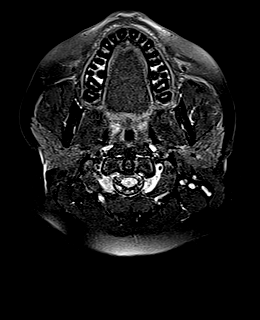
[im 18/52]
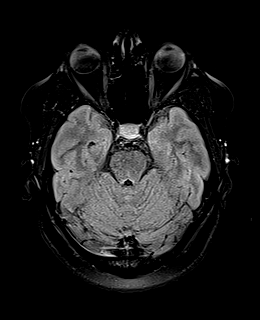
[im 35/52]
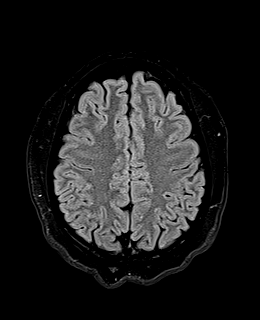
[im 52/52]
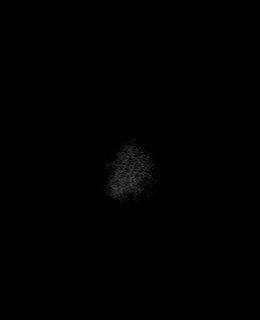

[Series 13: T1 · axial · 1.0mm · 0.94mm/px · z∈[-79,+64]mm · 10 of 144 slices shown (2 of 2)]
[im 1/144]
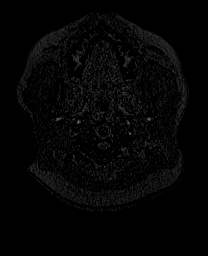
[im 16/144]
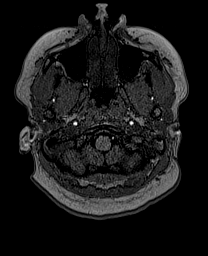
[im 32/144]
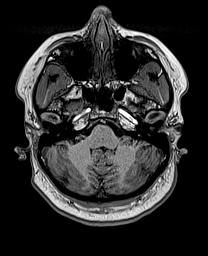
[im 48/144]
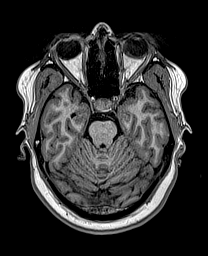
[im 64/144]
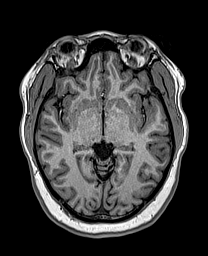
[im 80/144]
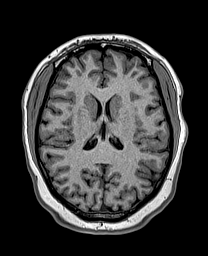
[im 96/144]
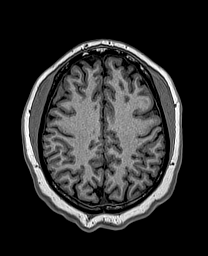
[im 112/144]
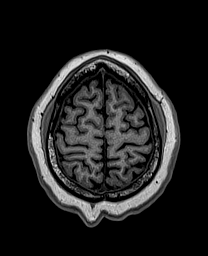
[im 128/144]
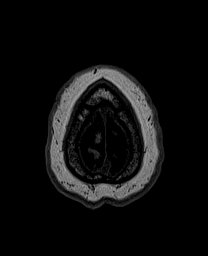
[im 144/144]
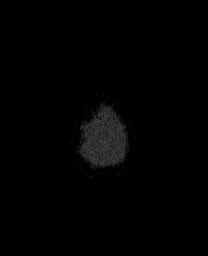

[Series 14: cor dwi_tracew · coronal · 5.0mm · 1.53mm/px · 4 of 52 slices shown]
[im 1/52]
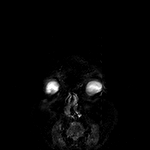
[im 18/52]
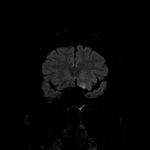
[im 35/52]
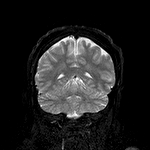
[im 52/52]
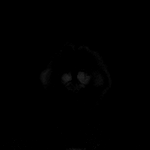

[Series 15: cor dwi_adc · coronal · 5.0mm · 1.53mm/px · 2 of 26 slices shown]
[im 1/26]
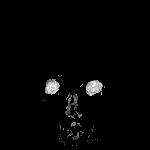
[im 26/26]
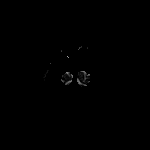

[Series 16: T2 · coronal · 5.0mm · 0.57mm/px · 2 of 32 slices shown (2 of 2)]
[im 1/32]
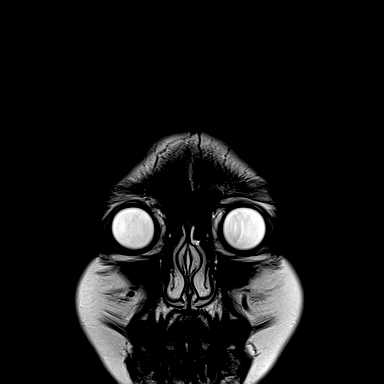
[im 32/32]
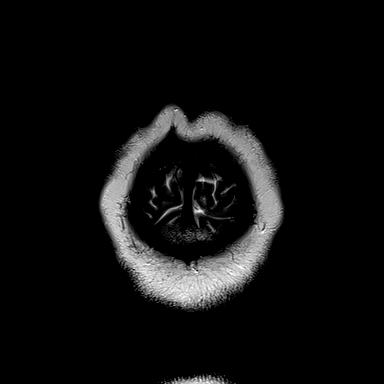

[48 of 48 positions shown; findings below may reference images not displayed]

FINDINGS: Brain: No acute infarction, hemorrhage, hydrocephalus, extra-axial
collection or mass lesion. No white matter lesions. The absence of
contrast precludes evaluation for enhancement.

Vascular: Major arterial flow voids are maintained at the skull
base. Small right vertebral artery, possibly non dominant.

Skull and upper cervical spine: Normal marrow signal.

Sinuses/Orbits: Clear sinuses.

Other: No mastoid effusions.
IMPRESSION: No evidence of acute intracranial abnormality. No white matter
lesions.

## 2022-11-21 ENCOUNTER — Ambulatory Visit (INDEPENDENT_AMBULATORY_CARE_PROVIDER_SITE_OTHER): Payer: BLUE CROSS/BLUE SHIELD | Admitting: Family Medicine

## 2022-11-21 ENCOUNTER — Encounter: Payer: Self-pay | Admitting: Family Medicine

## 2022-11-21 VITALS — BP 102/70 | HR 88 | Temp 98.5°F | Ht 65.0 in | Wt 295.5 lb

## 2022-11-21 DIAGNOSIS — E079 Disorder of thyroid, unspecified: Secondary | ICD-10-CM | POA: Diagnosis not present

## 2022-11-21 DIAGNOSIS — R7303 Prediabetes: Secondary | ICD-10-CM

## 2022-11-21 DIAGNOSIS — N915 Oligomenorrhea, unspecified: Secondary | ICD-10-CM | POA: Diagnosis not present

## 2022-11-21 LAB — POCT GLYCOSYLATED HEMOGLOBIN (HGB A1C): Hemoglobin A1C: 5.3 % (ref 4.0–5.6)

## 2022-11-21 NOTE — Assessment & Plan Note (Addendum)
A1C performed in office today and is 5.3. Patient was given education and handouts on low carb diet. I will check CMP, lipid panel, CBC today

## 2022-11-21 NOTE — Assessment & Plan Note (Signed)
Checking morning testosterone, 17 hydroxyprogesterone and US transvag to rule out PCOS.

## 2022-11-21 NOTE — Progress Notes (Signed)
New Patient Office Visit  Subjective    Patient ID: Kelly Potter, female    DOB: [birth year]  Age: 27 y.o. MRN: [id number]  CC:  Chief Complaint  Patient presents with   Establish Care    HPI Kelly Potter presents to establish care Pt has moved back recently to the area and needs a new PCP. She has a history of prediabetes, does not take any medications currently.   Pt reports that she does have a history of irregular periods, states she sometimes will go at least a week late, one occasion she went 4 months without a period. She reports there is no possibility of pregnancy at this time. She also reports that she has never had a pap smear, she is sexually active with women only.   Pt also reports a history of thyroid disease. States in the past she was treated with levothyroxine 25 mcg daily but hasn't been on this medication is some years.   Outpatient Encounter Medications as of 11/21/2022  Medication Sig   Ascorbic Acid (VITAMIN C PO) Take by mouth daily.   Cyanocobalamin (B-12 PO) Take by mouth daily.   ELDERBERRY PO Take by mouth.   [DISCONTINUED] levothyroxine (SYNTHROID) 25 MCG tablet Take 1 tablet (25 mcg total) by mouth daily before breakfast.   No facility-administered encounter medications on file as of 11/21/2022.    Past Medical History:  Diagnosis Date   Gout    Hives    allergy testing performed with no results   Prediabetes    Thyroid disease     History reviewed. No pertinent surgical history.  Family History  Problem Relation Age of Onset   Hypertension Mother    Hypertension Father    Gout Father    Thyroid disease Father    Alcohol abuse Brother    Diabetes Maternal Aunt    Diabetes Maternal Grandmother     Social History   Socioeconomic History   Marital status: Single    Spouse name: Not on file   Number of children: Not on file   Years of education: Not on file   Highest education level: Not on file  Occupational History   Not on file   Tobacco Use   Smoking status: Former    Types: Cigarettes   Smokeless tobacco: Never  Vaping Use   Vaping Use: Never used  Substance and Sexual Activity   Alcohol use: Yes   Drug use: No   Sexual activity: Yes  Other Topics Concern   Not on file  Social History Narrative   Not on file   Social Determinants of Health   Financial Resource Strain: Not on file  Food Insecurity: Not on file  Transportation Needs: Not on file  Physical Activity: Not on file  Stress: Not on file  Social Connections: Not on file  Intimate Partner Violence: Not on file    Review of Systems  All other systems reviewed and are negative.       Objective    BP 102/70 (BP Location: Left Arm, Patient Position: Sitting, Cuff Size: Large)   Pulse 88   Temp 98.5 F (36.9 C) (Oral)   Ht '5\' 5"'$  (1.651 m)   Wt 295 lb 8 oz (134 kg)   LMP 10/22/2022 (Exact Date)   SpO2 98%   BMI 49.17 kg/m   Physical Exam Vitals reviewed.  Constitutional:      Appearance: Normal appearance. She is well-groomed. She is morbidly obese.  Eyes:  Conjunctiva/sclera: Conjunctivae normal.  Neck:     Thyroid: No thyromegaly.  Cardiovascular:     Rate and Rhythm: Normal rate and regular rhythm.     Pulses: Normal pulses.     Heart sounds: S1 normal and S2 normal.  Pulmonary:     Effort: Pulmonary effort is normal.     Breath sounds: Normal breath sounds and air entry.  Abdominal:     General: Bowel sounds are normal.  Musculoskeletal:     Right lower leg: No edema.     Left lower leg: No edema.  Neurological:     Mental Status: She is alert and oriented to person, place, and time. Mental status is at baseline.     Gait: Gait is intact.  Psychiatric:        Mood and Affect: Mood and affect normal.        Speech: Speech normal.        Behavior: Behavior normal.        Judgment: Judgment normal.         Assessment & Plan:   Problem List Items Addressed This Visit       Unprioritized    Prediabetes - Primary (Chronic)    A1C performed in office today and is 5.3. Patient was given education and handouts on low carb diet. I will check CMP, lipid panel, CBC today      Relevant Orders   POC HgB A1c (Completed)   CBC (no diff)   CMP   Lipid Panel   Thyroid disease    History of, will check new TSH today. Not currently on medication.      Relevant Orders   TSH   Oligomenorrhea    Checking morning testosterone, 17 hydroxyprogesterone and US transvag to rule out PCOS.      Relevant Orders   Testosterone   17-Hydroxyprogesterone   US Transvaginal Non-OB    Return for annual physical with pap smear.   Farrel Conners, MD

## 2022-11-21 NOTE — Assessment & Plan Note (Signed)
History of, will check new TSH today. Not currently on medication.

## 2022-11-24 ENCOUNTER — Other Ambulatory Visit: Payer: Self-pay | Admitting: Family Medicine

## 2022-11-24 DIAGNOSIS — N915 Oligomenorrhea, unspecified: Secondary | ICD-10-CM

## 2022-12-22 ENCOUNTER — Ambulatory Visit (INDEPENDENT_AMBULATORY_CARE_PROVIDER_SITE_OTHER): Payer: BLUE CROSS/BLUE SHIELD | Admitting: Family Medicine

## 2022-12-22 ENCOUNTER — Encounter: Payer: Self-pay | Admitting: Family Medicine

## 2022-12-22 ENCOUNTER — Other Ambulatory Visit (HOSPITAL_COMMUNITY)
Admission: RE | Admit: 2022-12-22 | Discharge: 2022-12-22 | Disposition: A | Discharge status: Home / Self Care | Payer: BLUE CROSS/BLUE SHIELD | Source: Ambulatory Visit | Attending: Family Medicine | Admitting: Family Medicine

## 2022-12-22 VITALS — BP 100/76 | HR 80 | Temp 98.3°F | Ht 65.0 in | Wt 289.3 lb

## 2022-12-22 DIAGNOSIS — Z01419 Encounter for gynecological examination (general) (routine) without abnormal findings: Secondary | ICD-10-CM

## 2022-12-22 DIAGNOSIS — R87610 Atypical squamous cells of undetermined significance on cytologic smear of cervix (ASC-US): Secondary | ICD-10-CM | POA: Diagnosis not present

## 2022-12-22 DIAGNOSIS — R400 Somnolence: Secondary | ICD-10-CM

## 2022-12-22 MED ORDER — PHENTERMINE HCL 15 MG PO CAPS: 15.0000 mg | ORAL_CAPSULE | ORAL | 0 refills | Status: DC

## 2022-12-22 MED ORDER — TOPIRAMATE 50 MG PO TABS: 25.0000 mg | ORAL_TABLET | Freq: Every day | ORAL | 0 refills | Status: DC

## 2022-12-22 NOTE — Progress Notes (Unsigned)
Complete physical exam  Patient: Kelly Potter   DOB: 01-Mar-1996   26 y.o. Female  MRN: [id number]  Subjective:    Chief Complaint  Patient presents with   Annual Exam    Kelly Potter is a 27 y.o. female who presents today for a complete physical exam. She reports consuming a general diet. The patient does not participate in regular exercise at present. She generally feels well. She reports sleeping poorly. She does not have additional problems to discuss today.   Pt reports she sleeps about 6-7 hours but does not feel well rested in the morning. States she gets excessively tired around 2-3 pm in the afternoon, like she could fall asleep in the middle of the day.   Most recent fall risk assessment:    01/07/2021    8:55 AM  Fall Risk   Falls in the past year? 0     Most recent depression screenings:    11/21/2022   11:12 AM 01/07/2021    8:55 AM  PHQ 2/9 Scores  PHQ - 2 Score 0 0    Dental: No current dental problems and No regular dental care   Patient Active Problem List   Diagnosis Date Noted   Prediabetes 11/21/2022   Morbid obesity 11/21/2022   Thyroid disease 11/21/2022   Oligomenorrhea 11/21/2022      Patient Care Team: Karie Georges, MD as PCP - General (Family Medicine)   Outpatient Medications Prior to Visit  Medication Sig   Ascorbic Acid (VITAMIN C PO) Take by mouth daily.   Cyanocobalamin (B-12 PO) Take by mouth daily.   ELDERBERRY PO Take by mouth.   No facility-administered medications prior to visit.    Review of Systems  HENT:  Negative for hearing loss.   Eyes:  Negative for blurred vision.  Respiratory:  Negative for shortness of breath.   Cardiovascular:  Negative for chest pain.  Gastrointestinal: Negative.   Genitourinary: Negative.   Musculoskeletal:  Negative for back pain.  Neurological:  Negative for headaches.  Psychiatric/Behavioral:  Negative for depression.   All other systems reviewed and are  negative.         Objective:     BP 100/76 (BP Location: Left Arm, Patient Position: Sitting, Cuff Size: Large)   Pulse 80   Temp 98.3 F (36.8 C) (Oral)   Ht 5\' 5"  (1.651 m)   Wt 289 lb 4.8 oz (131.2 kg)   LMP 11/20/2022 (Exact Date)   SpO2 97%   BMI 48.14 kg/m  {Vitals History (Optional):23777}  Physical Exam Vitals reviewed. Exam conducted with a chaperone present.  Constitutional:      Appearance: She is obese.  Cardiovascular:     Rate and Rhythm: Normal rate and regular rhythm.     Pulses: Normal pulses.     Heart sounds: Normal heart sounds.  Pulmonary:     Effort: Pulmonary effort is normal.     Breath sounds: Normal breath sounds.  Chest:     Chest wall: No mass.  Breasts:    Tanner Score is 5.     Right: Normal. No mass or tenderness.     Left: Normal. No mass or tenderness.  Abdominal:     General: Abdomen is flat. Bowel sounds are normal.     Palpations: Abdomen is soft.  Genitourinary:    General: Normal vulva.     Exam position: Lithotomy position.     Tanner stage (genital): 5.  Vagina: Normal.     Cervix: Normal.     Uterus: Normal.      Adnexa: Right adnexa normal and left adnexa normal.     Rectum: Normal.  Lymphadenopathy:     Upper Body:     Right upper body: No axillary adenopathy.     Left upper body: No axillary adenopathy.  Neurological:     General: No focal deficit present.     Mental Status: She is alert and oriented to person, place, and time.  Psychiatric:        Mood and Affect: Mood and affect normal.     No results found for any visits on 12/22/22. {Show previous labs (optional):23779}    Assessment & Plan:    Routine Health Maintenance and Physical Exam   There is no immunization history on file for this patient.  Health Maintenance  Topic Date Due   HIV Screening  Never done   Hepatitis C Screening  Never done   DTaP/Tdap/Td (1 - Tdap) Never done   PAP-Cervical Cytology Screening  Never done   PAP  SMEAR-Modifier  Never done   COVID-19 Vaccine (1) 01/07/2023 (Originally 03/08/1996)   INFLUENZA VACCINE  04/16/2023   HPV VACCINES  Aged Out    Discussed health benefits of physical activity, and encouraged her to engage in regular exercise appropriate for her age and condition.  Problem List Items Addressed This Visit   None Visit Diagnoses     Daytime somnolence    -  Primary   Relevant Orders   Ambulatory referral to Pulmonology   Well woman exam with routine gynecological exam       Relevant Orders   Cytology - PAP ([place])      Return in 6 months (on 06/23/2023).     Karie Georges, MD

## 2022-12-22 NOTE — Patient Instructions (Addendum)
Keep calories at 1500 per day  Total protein --- 105 gram per day  Total carbs-- 90 gram or less per day.  Use carb tracking apps to help monitor.  Call your insurance company -- ask about wegovy, saxenda, or zepbound-- see if they cover these medications.  Health Maintenance, Female Adopting a healthy lifestyle and getting preventive care are important in promoting health and wellness. Ask your health care provider about: The right schedule for you to have regular tests and exams. Things you can do on your own to prevent diseases and keep yourself healthy. What should I know about diet, weight, and exercise? Eat a healthy diet  Eat a diet that includes plenty of vegetables, fruits, low-fat dairy products, and lean protein. Do not eat a lot of foods that are high in solid fats, added sugars, or sodium. Maintain a healthy weight Body mass index (BMI) is used to identify weight problems. It estimates body fat based on height and weight. Your health care provider can help determine your BMI and help you achieve or maintain a healthy weight. Get regular exercise Get regular exercise. This is one of the most important things you can do for your health. Most adults should: Exercise for at least 150 minutes each week. The exercise should increase your heart rate and make you sweat (moderate-intensity exercise). Do strengthening exercises at least twice a week. This is in addition to the moderate-intensity exercise. Spend less time sitting. Even light physical activity can be beneficial. Watch cholesterol and blood lipids Have your blood tested for lipids and cholesterol at 28 years of age, then have this test every 5 years. Have your cholesterol levels checked more often if: Your lipid or cholesterol levels are high. You are older than 28 years of age. You are at high risk for heart disease. What should I know about cancer screening? Depending on your health history and family history, you  may need to have cancer screening at various ages. This may include screening for: Breast cancer. Cervical cancer. Colorectal cancer. Skin cancer. Lung cancer. What should I know about heart disease, diabetes, and high blood pressure? Blood pressure and heart disease High blood pressure causes heart disease and increases the risk of stroke. This is more likely to develop in people who have high blood pressure readings or are overweight. Have your blood pressure checked: Every 3-5 years if you are 8-69 years of age. Every year if you are 33 years old or older. Diabetes Have regular diabetes screenings. This checks your fasting blood sugar level. Have the screening done: Once every three years after age 59 if you are at a normal weight and have a low risk for diabetes. More often and at a younger age if you are overweight or have a high risk for diabetes. What should I know about preventing infection? Hepatitis B If you have a higher risk for hepatitis B, you should be screened for this virus. Talk with your health care provider to find out if you are at risk for hepatitis B infection. Hepatitis C Testing is recommended for: Everyone born from [birth year] through 1966. Anyone with known risk factors for hepatitis C. Sexually transmitted infections (STIs) Get screened for STIs, including gonorrhea and chlamydia, if: You are sexually active and are younger than 28 years of age. You are older than 28 years of age and your health care provider tells you that you are at risk for this type of infection. Your sexual activity has changed since you  were last screened, and you are at increased risk for chlamydia or gonorrhea. Ask your health care provider if you are at risk. Ask your health care provider about whether you are at high risk for HIV. Your health care provider may recommend a prescription medicine to help prevent HIV infection. If you choose to take medicine to prevent HIV, you should first  get tested for HIV. You should then be tested every 3 months for as long as you are taking the medicine. Pregnancy If you are about to stop having your period (premenopausal) and you may become pregnant, seek counseling before you get pregnant. Take 400 to 800 micrograms (mcg) of folic acid every day if you become pregnant. Ask for birth control (contraception) if you want to prevent pregnancy. Osteoporosis and menopause Osteoporosis is a disease in which the bones lose minerals and strength with aging. This can result in bone fractures. If you are 50 years old or older, or if you are at risk for osteoporosis and fractures, ask your health care provider if you should: Be screened for bone loss. Take a calcium or vitamin D supplement to lower your risk of fractures. Be given hormone replacement therapy (HRT) to treat symptoms of menopause. Follow these instructions at home: Alcohol use Do not drink alcohol if: Your health care provider tells you not to drink. You are pregnant, may be pregnant, or are planning to become pregnant. If you drink alcohol: Limit how much you have to: 0-1 drink a day. Know how much alcohol is in your drink. In the [place] one drink equals one 12 oz bottle of beer (355 mL), one 5 oz glass of wine (148 mL), or one 1 oz glass of hard liquor (44 mL). Lifestyle Do not use any products that contain nicotine or tobacco. These products include cigarettes, chewing tobacco, and vaping devices, such as e-cigarettes. If you need help quitting, ask your health care provider. Do not use street drugs. Do not share needles. Ask your health care provider for help if you need support or information about quitting drugs. General instructions Schedule regular health, dental, and eye exams. Stay current with your vaccines. Tell your health care provider if: You often feel depressed. You have ever been abused or do not feel safe at home. Summary Adopting a healthy lifestyle and  getting preventive care are important in promoting health and wellness. Follow your health care provider's instructions about healthy diet, exercising, and getting tested or screened for diseases. Follow your health care provider's instructions on monitoring your cholesterol and blood pressure. This information is not intended to replace advice given to you by your health care provider. Make sure you discuss any questions you have with your health care provider. Document Revised: 01/21/2021 Document Reviewed: 01/21/2021 Elsevier Patient Education  2023 [employer].

## 2022-12-23 ENCOUNTER — Other Ambulatory Visit: Payer: BLUE CROSS/BLUE SHIELD

## 2022-12-26 ENCOUNTER — Telehealth: Payer: Self-pay | Admitting: Family Medicine

## 2022-12-26 NOTE — Telephone Encounter (Signed)
Pt requesting a call regarding phentermine 15 MG capsule says she is having side effects and wants to know if they are to be expected. Pt says she takes a break around 445 and that would be a good time to call

## 2022-12-26 NOTE — Telephone Encounter (Signed)
Yes side effects can include heart racing, dry mouth, anxiety and constipation. Usually the longer she takes the medication the better the side effects get, but if it is too severe she should stop the medication.

## 2022-12-26 NOTE — Telephone Encounter (Signed)
Called the patient and attempted to go over the message below and the patient disconnected the call.

## 2022-12-26 NOTE — Telephone Encounter (Signed)
Left a detailed message requesting the patient call back with the exact side effects she is having and the message will be forwarded to PCP for review.

## 2022-12-26 NOTE — Telephone Encounter (Signed)
Patient called in stating someone called her.

## 2022-12-30 LAB — CYTOLOGY - PAP
Chlamydia: NEGATIVE
Comment: NEGATIVE
Comment: NEGATIVE
Comment: NEGATIVE
Comment: NORMAL
Diagnosis: UNDETERMINED — AB
High risk HPV: POSITIVE — AB
Neisseria Gonorrhea: NEGATIVE
Trichomonas: NEGATIVE

## 2022-12-30 NOTE — Telephone Encounter (Signed)
Unable to leave a message due to voicemail not being set up yet. 

## 2022-12-30 NOTE — Telephone Encounter (Signed)
Patient called back and was informed of the message below.  Patient questioned what would the side effects be if she stopped taking the medication?  Also she read where she should lose 5-8.3lbs in a month and wanted to know how much weight should she be losing?  Message sent to PCP.  Patient requested the reply be sent via Mychart message due to her work schedule.

## 2022-12-30 NOTE — Telephone Encounter (Signed)
There is no set amount for the weight to be lost, it is different for each person. If she stopped the medication then the side effects will go away.

## 2022-12-31 NOTE — Addendum Note (Signed)
Addended by: Karie Georges on: 12/31/2022 03:12 PM   Modules accepted: Orders

## 2023-01-12 ENCOUNTER — Other Ambulatory Visit: Payer: BLUE CROSS/BLUE SHIELD

## 2023-01-21 MED ORDER — TOPIRAMATE 50 MG PO TABS: 25.0000 mg | ORAL_TABLET | Freq: Every day | ORAL | 2 refills | Status: AC

## 2023-01-21 MED ORDER — PHENTERMINE HCL 15 MG PO CAPS: 15.0000 mg | ORAL_CAPSULE | ORAL | 0 refills | Status: AC

## 2023-01-21 NOTE — Addendum Note (Signed)
Addended by: Karie Georges on: 01/21/2023 10:22 AM   Modules accepted: Orders

## 2023-02-06 NOTE — Telephone Encounter (Signed)
Noted  

## 2023-09-03 ENCOUNTER — Ambulatory Visit: Payer: Self-pay | Admitting: Family Medicine

## 2024-04-22 ENCOUNTER — Ambulatory Visit: Payer: Self-pay

## 2024-04-22 NOTE — Telephone Encounter (Signed)
 Appt scheduled for today with Dr Micheal.

## 2024-04-22 NOTE — Telephone Encounter (Signed)
 Noted- ok to close.

## 2024-04-22 NOTE — Telephone Encounter (Signed)
 FYI Only or Action Required?: FYI only for provider.  Patient was last seen in primary care on 12/22/2022 by Ozell Heron HERO, MD.  Called Nurse Triage reporting Urticaria.  Symptoms began a week ago.  Interventions attempted: Prescription medications: Prior Prednisone  Rx, and Benadryl .  Symptoms are: gradually worsening.  Triage Disposition: See Physician Within 24 Hours  Patient/caregiver understands and will follow disposition?: Yes  **Patient scheduled for 8/11 to discuss current Hives symptoms, patient states she will also keep her previously scheduled 8/15 appt. To discuss other concerns with PCP**            Reason for Disposition  [1] MODERATE-SEVERE hives (e.g.,hives interfere with normal activities or work) AND [2] not improved after taking antihistamine (e.g., cetirizine, fexofenadine, or loratadine) > 24 hours  Answer Assessment - Initial Assessment Questions 1. APPEARANCE: What does the rash look like?      Big welts  2. LOCATION: Where is the rash located?      Back of thighs, feet, breast, and Left for arm  3. NUMBER: How many hives are there?      Several, too many to count   4. SIZE: How big are the hives? (e.g., inches, cm, compare to coins) Do they all look the same or do they vary in shape and size?       Different sizes from dime size to quarter size  5. ONSET: When did the hives begin? (e.g., hours or days ago)      X 1 week ago, progressively getting worse  6. ITCHING: Does it itch? If Yes, ask: How bad is the itch?  (e.g., none, mild, moderate, severe)     Itching-severe   7. RECURRENT PROBLEM: Have you had hives before? If Yes, ask: When was the last time? and What happened that time?     Yes, teenage years  8. TRIGGERS: Were you exposed to any new food, plant, cosmetic product or animal just before the hives began?     No   9. OTHER SYMPTOMS: Do you have any other symptoms? (e.g., fever, tongue swelling,  difficulty breathing, abdomen pain)     No   10. PREGNANCY: Is there any chance you are pregnant? When was your last menstrual period?       No   Lip swelling noted. Taking old Prednisone  from prior RX. As well as benadryl .  Protocols used: Hives-A-AH

## 2024-04-25 ENCOUNTER — Ambulatory Visit (INDEPENDENT_AMBULATORY_CARE_PROVIDER_SITE_OTHER): Payer: Self-pay | Admitting: Family Medicine

## 2024-04-25 ENCOUNTER — Encounter: Payer: Self-pay | Admitting: Family Medicine

## 2024-04-25 VITALS — BP 106/72 | HR 116 | Temp 98.1°F | Wt 320.3 lb

## 2024-04-25 DIAGNOSIS — L509 Urticaria, unspecified: Secondary | ICD-10-CM

## 2024-04-25 MED ORDER — PREDNISONE 20 MG PO TABS: ORAL_TABLET | ORAL | 0 refills | Status: AC

## 2024-04-25 NOTE — Progress Notes (Signed)
 Established Patient Office Visit  Subjective   Patient ID: Kelly Potter, female    DOB: Mar 18, 1996  Age: 28 y.o. MRN: [id number]  Chief Complaint  Patient presents with   Urticaria    HPI   Kelly Potter is seen with hives started about a week ago.  Diffuse involvement including thighs, trunk, lower legs, arms.  She has had history of hives in the past.  She states that years ago she took allergy shots.  She apparently tested positive for multiple things.  She is aware of strawberry and avocado allergies but no recent exposure.  No new medications.  No changes soaps or detergents.  Has been taking Benadryl  at night without much relief.  Also using cold compresses.  Very mild swelling lower lip.  No tongue edema.  No shortness of breath.  No wheezing.  No history of angioedema.  No swelling of the hands or feet.  Past Medical History:  Diagnosis Date   Gout    Hives    allergy testing performed with no results   Prediabetes    Thyroid disease    History reviewed. No pertinent surgical history.  reports that she has quit smoking. Her smoking use included cigarettes. She has never used smokeless tobacco. She reports current alcohol use. She reports that she does not use drugs. family history includes Alcohol abuse in her brother; Diabetes in her maternal aunt and maternal grandmother; Gout in her father; Hypertension in her father and mother; Thyroid disease in her father. Allergies  Allergen Reactions   Claritin [Loratadine]    Zyrtec [Cetirizine]     Swelling of eyes and lips    Review of Systems  Constitutional:  Negative for chills and fever.  HENT:  Negative for sore throat.   Respiratory:  Negative for cough and shortness of breath.   Cardiovascular:  Negative for chest pain.  Gastrointestinal:  Negative for abdominal pain.  Skin:  Positive for itching and rash.      Objective:     BP 106/72   Pulse (!) 116   Temp 98.1 F (36.7 C) (Oral)   Wt (!) 320 lb  4.8 oz (145.3 kg)   SpO2 97%   BMI 53.30 kg/m  BP Readings from Last 3 Encounters:  04/25/24 106/72  12/22/22 100/76  11/21/22 102/70   Wt Readings from Last 3 Encounters:  04/25/24 (!) 320 lb 4.8 oz (145.3 kg)  12/22/22 289 lb 4.8 oz (131.2 kg)  11/21/22 295 lb 8 oz (134 kg)      Physical Exam Vitals reviewed.  Constitutional:      General: She is not in acute distress.    Appearance: She is not ill-appearing.  Cardiovascular:     Rate and Rhythm: Normal rate and regular rhythm.  Pulmonary:     Effort: Pulmonary effort is normal.     Breath sounds: Normal breath sounds. No wheezing or rales.  Skin:    Findings: Rash present.     Comments: She has slightly raised erythematous classic urticarial lesions involving her trunk, lower, and upper extremities  Neurological:     Mental Status: She is alert.      No results found for any visits on 04/25/24.    The ASCVD Risk score (Arnett DK, et al., 2019) failed to calculate for the following reasons:   The 2019 ASCVD risk score is only valid for ages 13 to 40    Assessment & Plan:   Diffuse hives.  Patient has  history of hives in the past.  Etiology unclear.  No new soaps or detergents.  No recent new medications.  She does have multiple allergies and has been treated with immunotherapy in the past.  -Continue Benadryl  = Consider adding additional over-the-counter antihistamine such as Xyzal or Allegra - Consider H2 blocker such as Pepcid 20 mg twice daily - Start prednisone  20 mg 2 tablets daily for 5 days - Consider referral back to allergist if she has continued or recurrent hives  Wolm Scarlet, MD

## 2024-04-25 NOTE — Patient Instructions (Signed)
 Consider over the counter H2 blocker such as Pepcid 20 mg twice daily  Consider over the counter anti-histamine such as Allegra or Xyzal.

## 2024-04-27 ENCOUNTER — Other Ambulatory Visit: Payer: Self-pay

## 2024-04-27 ENCOUNTER — Encounter (HOSPITAL_COMMUNITY): Payer: Self-pay

## 2024-04-27 ENCOUNTER — Emergency Department (HOSPITAL_COMMUNITY)
Admission: EM | Admit: 2024-04-27 | Discharge: 2024-04-27 | Disposition: A | Discharge status: Home / Self Care | Attending: Emergency Medicine | Admitting: Emergency Medicine

## 2024-04-27 DIAGNOSIS — L500 Allergic urticaria: Secondary | ICD-10-CM | POA: Diagnosis present

## 2024-04-27 DIAGNOSIS — T7840XD Allergy, unspecified, subsequent encounter: Secondary | ICD-10-CM

## 2024-04-27 MED ORDER — DEXAMETHASONE SODIUM PHOSPHATE 10 MG/ML IJ SOLN
10.0000 mg | Freq: Once | INTRAMUSCULAR | Status: AC
  Administered 2024-04-27 (×2): 10 mg via INTRAMUSCULAR
  Filled 2024-04-27: qty 1

## 2024-04-27 MED ORDER — PREDNISONE 50 MG PO TABS: 50.0000 mg | ORAL_TABLET | Freq: Every day | ORAL | 0 refills | Status: DC

## 2024-04-27 NOTE — ED Triage Notes (Signed)
 Pt presents to ED from home C/O hives to back, legs, hands. Pt reports it feels like her throat is tight. Pt speech clear, speaking in complete sentences is triage. Took allegra around 0200, benadryl  around 0600 today with little improvement. States she saw a doctor yesterday for same, referral sent to allergist.

## 2024-04-27 NOTE — ED Provider Notes (Signed)
 [place] EMERGENCY DEPARTMENT AT [facility] Provider Note   CSN: [id number] Arrival date & time: 04/27/24  1053     Patient presents with: Allergic Reaction   Kelly Potter is a 28 y.o. female who presents with primary concern of hives that developed on her lower extremities as well as her neck and upper torso.  These have been intermittently present over the last several weeks, however the current episodes been present over the last 2 days.  She denies having any shortness of breath, denies having any sore throat or difficulty swallowing, and states that she was previously treated at urgent care with oral steroids with last dose being taken 4 days prior.  She is also taking daily fexofenadine over-the-counter, and took 50 mg of Benadryl  this morning for the her symptoms without any resolution.  She denies having any new soaps, detergents, perfumes, or any other household cleaners that may be an aggravating factor.    Allergic Reaction Presenting symptoms: rash        Prior to Admission medications   Medication Sig Start Date End Date Taking? Authorizing Provider  Ascorbic Acid (VITAMIN C PO) Take by mouth daily.    [provider]  Cyanocobalamin (B-12 PO) Take by mouth daily.    [provider]  ELDERBERRY PO Take by mouth.    [provider]  phentermine  15 MG capsule Take 1 capsule (15 mg total) by mouth every morning. 01/21/23   Ozell Heron HERO, MD  predniSONE  (DELTASONE ) 20 MG tablet Take two tablets by mouth once daily for 5 days. 04/25/24   Burchette, Wolm ORN, MD  topiramate  (TOPAMAX ) 50 MG tablet Take 0.5 tablets (25 mg total) by mouth at bedtime. 01/21/23   Ozell Heron HERO, MD    Allergies: Claritin [loratadine] and Zyrtec [cetirizine]    Review of Systems  Skin:  Positive for rash.  All other systems reviewed and are negative.   Updated Vital Signs BP 102/69 (BP Location: Left Arm)   Pulse (!) 124   Temp 98.2 F  (36.8 C) (Oral)   Resp 16   Ht 5' 5 (1.651 m)   Wt (!) 145.2 kg   SpO2 99%   BMI 53.25 kg/m   Physical Exam Vitals and nursing note reviewed.  Constitutional:      General: She is not in acute distress.    Appearance: She is well-developed.  HENT:     Head: Normocephalic and atraumatic.  Eyes:     Conjunctiva/sclera: Conjunctivae normal.  Cardiovascular:     Rate and Rhythm: Normal rate and regular rhythm.     Heart sounds: No murmur heard. Pulmonary:     Effort: Pulmonary effort is normal. No respiratory distress.     Breath sounds: Normal breath sounds and air entry.  Abdominal:     Palpations: Abdomen is soft.     Tenderness: There is no abdominal tenderness.  Musculoskeletal:        General: No swelling.     Cervical back: Neck supple.  Skin:    General: Skin is warm and dry.     Capillary Refill: Capillary refill takes less than 2 seconds.     Findings: Rash present. Rash is urticarial.      Neurological:     Mental Status: She is alert.  Psychiatric:        Mood and Affect: Mood normal.     (all labs ordered are listed, but only abnormal results are displayed) Labs Reviewed -  No data to display  EKG: None  Radiology: No results found.   Procedures   Medications Ordered in the ED  dexamethasone  (DECADRON ) injection 10 mg (10 mg Intramuscular Given 04/27/24 1202)                                    Medical Decision Making Risk Prescription drug management.   Medical Decision Making:   Kelly Potter is a 28 y.o. female who presented to the ED today with urticarial rash detailed above.     Complete initial physical exam performed, notably the patient  was alert and oriented in no apparent distress.  She does have an urticarial rash which is noted to the lower extremities as well as to the base of the neck and to the shoulders.  Lung sounds are clear and equal bilaterally with no adventitious sounds noted in the posterior oropharynx is clear  and patent..    Reviewed and confirmed nursing documentation for past medical history, family history, social history.    Initial Assessment:   With the patient's presentation of urticarial rash, most likely diagnosis is allergic reaction.    Initial Plan:  Provide IM dexamethasone  to help manage acute allergic reaction. As she has very recently been on a course of oral steroids, we will defer this and refer her to her primary care for continued management. Given her presentation and history of present illness, will defer any lab workup and imaging at this time. Objective evaluation as below reviewed   Reassessment and Plan:   Provided IM dexamethasone  as noted, and plan at this time is to discharge for follow-up with primary care regarding continued allergic reactions.  Discussed that she should review in her home any potential allergens, as recurring rash such as this may be secondary to environmental exposure.  Continue taking daily fexofenadine, and use oral diphenhydramine  as needed for itching/rash.       Final diagnoses:  Allergic reaction, subsequent encounter    ED Discharge Orders          Ordered    predniSONE  (DELTASONE ) 50 MG tablet  Daily with breakfast,   Status:  Discontinued        04/27/24 1156               Kelly Potter, Kelly Potter 04/27/24 1237    Randol Simmonds, MD 04/28/24 1418

## 2024-04-29 ENCOUNTER — Telehealth: Payer: Self-pay

## 2024-04-29 ENCOUNTER — Ambulatory Visit: Payer: Self-pay | Admitting: Family Medicine

## 2024-04-29 NOTE — Transitions of Care (Post Inpatient/ED Visit) (Signed)
   04/29/2024  Name: Kelly Potter MRN: [id number] DOB: 09-04-1996  Today's TOC FU Call Status: Today's TOC FU Call Status:: Unsuccessful Call (1st Attempt) Unsuccessful Call (1st Attempt) Date: 04/29/24  Attempted to reach the patient regarding the most recent Inpatient/ED visit.  Follow Up Plan: Additional outreach attempts will be made to reach the patient to complete the Transitions of Care (Post Inpatient/ED visit) call.   Signature Lauraine Solomons, CMA

## 2024-05-04 ENCOUNTER — Encounter: Payer: Self-pay | Admitting: Family Medicine
